# Patient Record
Sex: Male | Born: 1995 | Race: White | Hispanic: No | Marital: Single | Smoking: Never smoker
Health system: Southern US, Community
[De-identification: ages and names within clinical notes are randomized; demographics above are authoritative.]

## PROBLEM LIST (undated history)

## (undated) DIAGNOSIS — Z8489 Family history of other specified conditions: Secondary | ICD-10-CM

---
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
## 2019-05-30 ENCOUNTER — Ambulatory Visit: Payer: Self-pay | Admitting: Surgery

## 2019-05-30 NOTE — H&P (Signed)
Patrick Hopkins Documented: 05/30/2019 9:58 AM Location: [facility] Patient #: [id number] DOB: 12/06/1995 Undefined / Language: English / Race: White Male  History of Present Illness (Patrick Hopkins A. Rochelle Nephew MD; 05/30/2019 10:36 AM) Patient words: Patient sent at the request of Dr. Elaine Hopkins for right groin pain. He reports history of right groin pain and bulge the last 2 months. The pain is made worse with lifting, coughing or straining. He notices a small bulge in his right groin that pops in and pops out. The symptoms are interfering with his quality of life. The pain does radiate down into his testicle specimen was coughing. Denies any burning when he urinates. Denies nausea vomiting or abdominal pain otherwise.  The patient is a 22 year old male.   Past Surgical History (Patrick Hopkins, [name]; 05/30/2019 9:58 AM) No pertinent past surgical history  Diagnostic Studies History (Patrick Hopkins, [name]; 05/30/2019 9:58 AM) Colonoscopy never  Allergies (Patrick Hopkins, [name]; 05/30/2019 9:58 AM) No Known Drug Allergies [05/30/2019]:  Medication History (Patrick Hopkins, [name]; 05/30/2019 9:58 AM) No Current Medications Medications Reconciled  Social History (Patrick Hopkins, [name]; 05/30/2019 9:58 AM) Alcohol use Occasional alcohol use. Illicit drug use Prefer to discuss with provider. Tobacco use Never smoker.  Family History (Patrick Hopkins, [name]; 05/30/2019 9:58 AM) Alcohol Abuse Family Members In General.  Other Problems (Patrick Hopkins, [name]; 05/30/2019 9:58 AM) Back Pain     Review of Systems (Patrick Hopkins [name]; 05/30/2019 9:58 AM) General Not Present- Appetite Loss, Chills, Fatigue, Fever, Night Sweats, Weight Gain and Weight Loss. Skin Not Present- Change in Wart/Mole, Dryness, Hives, Jaundice, New Lesions, Non-Healing Wounds, Rash and Ulcer. HEENT Not Present- Earache, Hearing Loss, Hoarseness, Nose Bleed, Oral Ulcers, Ringing in the  Ears, Seasonal Allergies, Sinus Pain, Sore Throat, Visual Disturbances, Wears glasses/contact lenses and Yellow Eyes. Respiratory Not Present- Bloody sputum, Chronic Cough, Difficulty Breathing, Snoring and Wheezing. Breast Not Present- Breast Mass, Breast Pain, Nipple Discharge and Skin Changes. Cardiovascular Not Present- Chest Pain, Difficulty Breathing Lying Down, Leg Cramps, Palpitations, Rapid Heart Rate, Shortness of Breath and Swelling of Extremities. Gastrointestinal Present- Abdominal Pain. Not Present- Bloating, Bloody Stool, Change in Bowel Habits, Chronic diarrhea, Constipation, Difficulty Swallowing, Excessive gas, Gets full quickly at meals, Hemorrhoids, Indigestion, Nausea, Rectal Pain and Vomiting. Male Genitourinary Not Present- Blood in Urine, Change in Urinary Stream, Frequency, Impotence, Nocturia, Painful Urination, Urgency and Urine Leakage. Musculoskeletal Present- Back Pain. Not Present- Joint Pain, Joint Stiffness, Muscle Pain, Muscle Weakness and Swelling of Extremities. Neurological Not Present- Decreased Memory, Fainting, Headaches, Numbness, Seizures, Tingling, Tremor, Trouble walking and Weakness. Endocrine Not Present- Cold Intolerance, Excessive Hunger, Hair Changes, Heat Intolerance, Hot flashes and New Diabetes. Hematology Not Present- Blood Thinners, Easy Bruising, Excessive bleeding, Gland problems, HIV and Persistent Infections.  Vitals (Patrick Hopkins [name]; 05/30/2019 9:58 AM) 05/30/2019 9:58 AM Weight: 165.5 lb Height: 72in Body Surface Area: 1.97 m Body Mass Index: 22.45 kg/m  Pulse: 80 (Regular)  BP: 110/72 (Sitting, Left Arm, Standard)        Physical Exam (Patrick Hopkins A. Patrick Kovacic MD; 05/30/2019 10:37 AM)  General Mental Status-Alert. General Appearance-Consistent with stated age. Hydration-Well hydrated. Voice-Normal.  Head and Neck Head-normocephalic, atraumatic with no lesions or palpable  masses. Trachea-midline. Thyroid Gland Characteristics - normal size and consistency.  Eye Eyeball - Bilateral-Extraocular movements intact. Sclera/Conjunctiva - Bilateral-No scleral icterus.  Chest and Lung Exam Chest and lung exam reveals -quiet, even and easy respiratory effort with no use   of accessory muscles and on auscultation, normal breath sounds, no adventitious sounds and normal vocal resonance. Inspection Chest Wall - Normal. Back - normal.  Cardiovascular Cardiovascular examination reveals -normal heart sounds, regular rate and rhythm with no murmurs and normal pedal pulses bilaterally.  Abdomen Note: Reducible small right inguinal hernia. No evidence of left inguinal hernia.  Male Genitourinary Scrotum-Normal. Testes - Left-Normal - Left. Testes - Right-Normal - Right.  Neurologic Neurologic evaluation reveals -alert and oriented x 3 with no impairment of recent or remote memory. Mental Status-Normal.  Musculoskeletal Normal Exam - Left-Upper Extremity Strength Normal and Lower Extremity Strength Normal. Normal Exam - Right-Upper Extremity Strength Normal and Lower Extremity Strength Normal.    Assessment & Plan (Patrick Hopkins A. Patrick Maxson MD; 05/30/2019 10:38 AM)  RIGHT INGUINAL HERNIA (K40.90) Impression: Discussed repair with mesh versus observation. Discussed laparoscopic and open techniques. Patient shows an open right inguinal hernia repair with mesh. Discussed risk of chronic pain especially with tobacco use and complications associated with that. The risk of hernia repair include bleeding, infection, organ injury, bowel injury, bladder injury, nerve injury recurrent hernia, blood clots, worsening of underlying condition, chronic pain, mesh use, open surgery, death, and the need for other operattions. Pt agrees to proceed  Current Plans You are being scheduled for surgery- Our schedulers will call you.  You should hear from our office's  scheduling department within 5 working days about the location, date, and time of surgery. We try to make accommodations for patient's preferences in scheduling surgery, but sometimes the OR schedule or the surgeon's schedule prevents [place] from making those accommodations.  If you have not heard from our office [phone] in 5 working days, call the office and ask for your surgeon's nurse.  If you have other questions about your diagnosis, plan, or surgery, call the office and ask for your surgeon's nurse.  Pt Education - Pamphlet Given - Hernia Surgery: discussed with patient and provided information. Pt Education - CCS Mesh education: discussed with patient and provided information. Pt Education - Consent for inguinal hernia : discussed with patient and provided information.

## 2019-05-30 NOTE — H&P (View-Only) (Signed)
Patrick Hopkins Documented: 05/30/2019 9:58 AM Location: [facility] Patient #: [id number] DOB: 05-Nov-1995 Undefined / Language: [place] / Race: White Male  History of Present Illness [name](Janace Decker A. Jasmene Goswami MD; 05/30/2019 10:36 AM) Patient words: Patient sent at the request of Dr. Maurice SmallElaine Griffin for right groin pain. He reports history of right groin pain and bulge the last 2 months. The pain is made worse with lifting, coughing or straining. He notices a small bulge in his right groin that pops in and pops out. The symptoms are interfering with his quality of life. The pain does radiate down into his testicle specimen was coughing. Denies any burning when he urinates. Denies nausea vomiting or abdominal pain otherwise.  The patient is a 23 year old male.   Past Surgical History Marcelino Duster([name], [name]; 05/30/2019 9:58 AM) No pertinent past surgical history  Diagnostic Studies History Marcelino Duster([name], [name]; 05/30/2019 9:58 AM) Colonoscopy never  Allergies Marcelino Duster([name], [name]; 05/30/2019 9:58 AM) No Known Drug Allergies [05/30/2019]:  Medication History Marcelino Duster([name], [name]; 05/30/2019 9:58 AM) No Current Medications Medications Reconciled  Social History Marcelino Duster([name], [name]; 05/30/2019 9:58 AM) Alcohol use Occasional alcohol use. Illicit drug use Prefer to discuss with provider. Tobacco use Never smoker.  Family History Marcelino Duster(Michelle R. Shon BatonBrooks, [name]; 05/30/2019 9:58 AM) Alcohol Abuse Family Members In General.  Other Problems Marcelino Duster([name], [name]; 05/30/2019 9:58 AM) Back Pain     Review of Systems [facility]([name] [name]; 05/30/2019 9:58 AM) General Not Present- Appetite Loss, Chills, Fatigue, Fever, Night Sweats, Weight Gain and Weight Loss. Skin Not Present- Change in Wart/Mole, Dryness, Hives, Jaundice, New Lesions, Non-Healing Wounds, Rash and Ulcer. HEENT Not Present- Earache, Hearing Loss, Hoarseness, Nose Bleed, Oral Ulcers, Ringing in the  Ears, Seasonal Allergies, Sinus Pain, Sore Throat, Visual Disturbances, Wears glasses/contact lenses and Yellow Eyes. Respiratory Not Present- Bloody sputum, Chronic Cough, Difficulty Breathing, Snoring and Wheezing. Breast Not Present- Breast Mass, Breast Pain, Nipple Discharge and Skin Changes. Cardiovascular Not Present- Chest Pain, Difficulty Breathing Lying Down, Leg Cramps, Palpitations, Rapid Heart Rate, Shortness of Breath and Swelling of Extremities. Gastrointestinal Present- Abdominal Pain. Not Present- Bloating, Bloody Stool, Change in Bowel Habits, Chronic diarrhea, Constipation, Difficulty Swallowing, Excessive gas, Gets full quickly at meals, Hemorrhoids, Indigestion, Nausea, Rectal Pain and Vomiting. Male Genitourinary Not Present- Blood in Urine, Change in Urinary Stream, Frequency, Impotence, Nocturia, Painful Urination, Urgency and Urine Leakage. Musculoskeletal Present- Back Pain. Not Present- Joint Pain, Joint Stiffness, Muscle Pain, Muscle Weakness and Swelling of Extremities. Neurological Not Present- Decreased Memory, Fainting, Headaches, Numbness, Seizures, Tingling, Tremor, Trouble walking and Weakness. Endocrine Not Present- Cold Intolerance, Excessive Hunger, Hair Changes, Heat Intolerance, Hot flashes and New Diabetes. Hematology Not Present- Blood Thinners, Easy Bruising, Excessive bleeding, Gland problems, HIV and Persistent Infections.  Vitals [employer]([name] [name]; 05/30/2019 9:58 AM) 05/30/2019 9:58 AM Weight: 165.5 lb Height: 72in Body Surface Area: 1.97 m Body Mass Index: 22.45 kg/m  Pulse: 80 (Regular)  BP: 110/72 (Sitting, Left Arm, Standard)        Physical Exam (Aryon Nham A. Anayiah Howden MD; 05/30/2019 10:37 AM)  General Mental Status-Alert. General Appearance-Consistent with stated age. Hydration-Well hydrated. Voice-Normal.  Head and Neck Head-normocephalic, atraumatic with no lesions or palpable  masses. Trachea-midline. Thyroid Gland Characteristics - normal size and consistency.  Eye Eyeball - Bilateral-Extraocular movements intact. Sclera/Conjunctiva - Bilateral-No scleral icterus.  Chest and Lung Exam Chest and lung exam reveals -quiet, even and easy respiratory effort with no use  of accessory muscles and on auscultation, normal breath sounds, no adventitious sounds and normal vocal resonance. Inspection Chest Wall - Normal. Back - normal.  Cardiovascular Cardiovascular examination reveals -normal heart sounds, regular rate and rhythm with no murmurs and normal pedal pulses bilaterally.  Abdomen Note: Reducible small right inguinal hernia. No evidence of left inguinal hernia.  Male Genitourinary Scrotum-Normal. Testes - Left-Normal - Left. Testes - Right-Normal - Right.  Neurologic Neurologic evaluation reveals -alert and oriented x 3 with no impairment of recent or remote memory. Mental Status-Normal.  Musculoskeletal Normal Exam - Left-Upper Extremity Strength Normal and Lower Extremity Strength Normal. Normal Exam - Right-Upper Extremity Strength Normal and Lower Extremity Strength Normal.    Assessment & Plan (Trinidi Toppins A. Gao Mitnick MD; 05/30/2019 10:38 AM)  RIGHT INGUINAL HERNIA (K40.90) Impression: Discussed repair with mesh versus observation. Discussed laparoscopic and open techniques. Patient shows an open right inguinal hernia repair with mesh. Discussed risk of chronic pain especially with tobacco use and complications associated with that. The risk of hernia repair include bleeding, infection, organ injury, bowel injury, bladder injury, nerve injury recurrent hernia, blood clots, worsening of underlying condition, chronic pain, mesh use, open surgery, death, and the need for other operattions. Pt agrees to proceed  Current Plans You are being scheduled for surgery- Our schedulers will call you.  You should hear from our office's  scheduling department within 5 working days about the location, date, and time of surgery. We try to make accommodations for patient's preferences in scheduling surgery, but sometimes the OR schedule or the surgeon's schedule prevents [place] from making those accommodations.  If you have not heard from our office [phone] in 5 working days, call the office and ask for your surgeon's nurse.  If you have other questions about your diagnosis, plan, or surgery, call the office and ask for your surgeon's nurse.  Pt Education - Pamphlet Given - Hernia Surgery: discussed with patient and provided information. Pt Education - CCS Mesh education: discussed with patient and provided information. Pt Education - Consent for inguinal hernia : discussed with patient and provided information.

## 2019-06-22 ENCOUNTER — Other Ambulatory Visit: Payer: Self-pay

## 2019-06-22 ENCOUNTER — Encounter (HOSPITAL_BASED_OUTPATIENT_CLINIC_OR_DEPARTMENT_OTHER): Payer: Self-pay

## 2019-06-25 ENCOUNTER — Other Ambulatory Visit (HOSPITAL_COMMUNITY)
Admission: RE | Admit: 2019-06-25 | Discharge: 2019-06-25 | Disposition: A | Discharge status: Home / Self Care | Payer: BC Managed Care – PPO | Source: Ambulatory Visit | Attending: Surgery | Admitting: Surgery

## 2019-06-25 DIAGNOSIS — Z01812 Encounter for preprocedural laboratory examination: Secondary | ICD-10-CM | POA: Diagnosis present

## 2019-06-25 DIAGNOSIS — K4090 Unilateral inguinal hernia, without obstruction or gangrene, not specified as recurrent: Secondary | ICD-10-CM | POA: Insufficient documentation

## 2019-06-25 DIAGNOSIS — Z20828 Contact with and (suspected) exposure to other viral communicable diseases: Secondary | ICD-10-CM | POA: Diagnosis not present

## 2019-06-26 LAB — NOVEL CORONAVIRUS, NAA (HOSP ORDER, SEND-OUT TO REF LAB; TAT 18-24 HRS): SARS-CoV-2, NAA: NOT DETECTED

## 2019-06-27 ENCOUNTER — Other Ambulatory Visit: Payer: Self-pay

## 2019-06-27 ENCOUNTER — Encounter (HOSPITAL_BASED_OUTPATIENT_CLINIC_OR_DEPARTMENT_OTHER)
Admission: RE | Admit: 2019-06-27 | Discharge: 2019-06-27 | Disposition: A | Discharge status: Home / Self Care | Payer: BC Managed Care – PPO | Source: Ambulatory Visit | Attending: Surgery | Admitting: Surgery

## 2019-06-28 DIAGNOSIS — R1031 Right lower quadrant pain: Secondary | ICD-10-CM | POA: Diagnosis present

## 2019-06-28 DIAGNOSIS — K4090 Unilateral inguinal hernia, without obstruction or gangrene, not specified as recurrent: Secondary | ICD-10-CM | POA: Diagnosis not present

## 2019-06-28 LAB — COMPREHENSIVE METABOLIC PANEL
ALT: 15 U/L (ref 0–44)
AST: 21 U/L (ref 15–41)
Albumin: 4.5 g/dL (ref 3.5–5.0)
Alkaline Phosphatase: 50 U/L (ref 38–126)
Anion gap: 11 (ref 5–15)
BUN: 11 mg/dL (ref 6–20)
CO2: 27 mmol/L (ref 22–32)
Calcium: 9.4 mg/dL (ref 8.9–10.3)
Chloride: 102 mmol/L (ref 98–111)
Creatinine, Ser: 1.12 mg/dL (ref 0.61–1.24)
GFR calc Af Amer: >60 mL/min (ref >60)
GFR calc non Af Amer: >60 mL/min (ref >60)
Glucose, Bld: 95 mg/dL (ref 70–99)
Potassium: 5 mmol/L (ref 3.5–5.1)
Sodium: 140 mmol/L (ref 135–145)
Total Bilirubin: 0.7 mg/dL (ref 0.3–1.2)
Total Protein: 7 g/dL (ref 6.5–8.1)

## 2019-06-28 LAB — CBC WITH DIFFERENTIAL/PLATELET
Abs Immature Granulocytes: 0.01 10*3/uL (ref 0.00–0.07)
Basophils Absolute: 0 10*3/uL (ref 0.0–0.1)
Basophils Relative: 1 %
Eosinophils Absolute: 0.1 10*3/uL (ref 0.0–0.5)
Eosinophils Relative: 1 %
HCT: 42.9 % (ref 39.0–52.0)
Hemoglobin: 14.6 g/dL (ref 13.0–17.0)
Immature Granulocytes: 0 %
Lymphocytes Relative: 43 %
Lymphs Abs: 1.6 10*3/uL (ref 0.7–4.0)
MCH: 31.7 pg (ref 26.0–34.0)
MCHC: 34 g/dL (ref 30.0–36.0)
MCV: 93.1 fL (ref 80.0–100.0)
Monocytes Absolute: 0.3 10*3/uL (ref 0.1–1.0)
Monocytes Relative: 9 %
Neutro Abs: 1.7 10*3/uL (ref 1.7–7.7)
Neutrophils Relative %: 46 %
Platelets: 172 10*3/uL (ref 150–400)
RBC: 4.61 MIL/uL (ref 4.22–5.81)
RDW: 12.2 % (ref 11.5–15.5)
WBC: 3.7 10*3/uL — ABNORMAL LOW (ref 4.0–10.5)
nRBC: 0 % (ref 0.0–0.2)

## 2019-06-28 NOTE — Progress Notes (Signed)

## 2019-06-28 NOTE — Anesthesia Preprocedure Evaluation (Addendum)
Anesthesia Evaluation  Patient identified by MRN, date of birth, ID band Patient awake    Reviewed: Allergy & Precautions, NPO status , Patient's Chart, lab work & pertinent test results  History of Anesthesia Complications (+) Family history of anesthesia reaction  Airway Mallampati: II  TM Distance: >3 FB Neck ROM: Full    Dental no notable dental hx. (+) Teeth Intact, Dental Advisory Given   Pulmonary neg pulmonary ROS,    Pulmonary exam normal breath sounds clear to auscultation       Cardiovascular Exercise Tolerance: Good Normal cardiovascular exam Rhythm:Regular Rate:Normal     Neuro/Psych negative neurological ROS  negative psych ROS   GI/Hepatic Neg liver ROS,   Endo/Other  negative endocrine ROS  Renal/GU negative Renal ROS  negative genitourinary   Musculoskeletal negative musculoskeletal ROS (+)   Abdominal   Peds  Hematology negative hematology ROS (+)   Anesthesia Other Findings   Reproductive/Obstetrics                           Anesthesia Physical Anesthesia Plan  ASA: II  Anesthesia Plan: General   Post-op Pain Management: GA combined w/ Regional for post-op pain   Induction: Intravenous  PONV Risk Score and Plan: 3 and Treatment may vary due to age or medical condition, Ondansetron and Dexamethasone  Airway Management Planned: LMA  Additional Equipment: None  Intra-op Plan:   Post-operative Plan:   Informed Consent: I have reviewed the patients History and Physical, chart, labs and discussed the procedure including the risks, benefits and alternatives for the proposed anesthesia with the patient or authorized representative who has indicated his/her understanding and acceptance.     Dental advisory given  Plan Discussed with: CRNA and Anesthesiologist  Anesthesia Plan Comments: (LMA w  TAP Block)       Anesthesia Quick Evaluation

## 2019-06-29 ENCOUNTER — Encounter (HOSPITAL_BASED_OUTPATIENT_CLINIC_OR_DEPARTMENT_OTHER): Payer: Self-pay

## 2019-06-29 ENCOUNTER — Ambulatory Visit (HOSPITAL_BASED_OUTPATIENT_CLINIC_OR_DEPARTMENT_OTHER): Payer: BC Managed Care – PPO | Admitting: Certified Registered Nurse Anesthetist

## 2019-06-29 ENCOUNTER — Other Ambulatory Visit: Payer: Self-pay

## 2019-06-29 ENCOUNTER — Encounter (HOSPITAL_BASED_OUTPATIENT_CLINIC_OR_DEPARTMENT_OTHER)
Admission: RE | Disposition: A | Discharge status: Home / Self Care | Payer: Self-pay | Source: Home / Self Care | Attending: Surgery

## 2019-06-29 ENCOUNTER — Ambulatory Visit (HOSPITAL_BASED_OUTPATIENT_CLINIC_OR_DEPARTMENT_OTHER)
Admission: RE | Admit: 2019-06-29 | Discharge: 2019-06-29 | Disposition: A | Discharge status: Home / Self Care | Payer: BC Managed Care – PPO | Attending: Surgery | Admitting: Surgery

## 2019-06-29 DIAGNOSIS — K4090 Unilateral inguinal hernia, without obstruction or gangrene, not specified as recurrent: Secondary | ICD-10-CM | POA: Insufficient documentation

## 2019-06-29 HISTORY — DX: Family history of other specified conditions: Z84.89

## 2019-06-29 HISTORY — PX: INGUINAL HERNIA REPAIR: SHX194

## 2019-06-29 SURGERY — REPAIR, HERNIA, INGUINAL, ADULT
Anesthesia: General | Site: Groin | Laterality: Right

## 2019-06-29 MED ORDER — IBUPROFEN 800 MG PO TABS: 800.0000 mg | ORAL_TABLET | Freq: Three times a day (TID) | ORAL | 0 refills | Status: AC | PRN

## 2019-06-29 MED ORDER — ONDANSETRON HCL 4 MG/2ML IJ SOLN
INTRAMUSCULAR | Status: AC
  Filled 2019-06-29: qty 2

## 2019-06-29 MED ORDER — ONDANSETRON HCL 4 MG/2ML IJ SOLN
INTRAMUSCULAR | Status: DC | PRN
  Administered 2019-06-29: 4 mg via INTRAVENOUS

## 2019-06-29 MED ORDER — ONDANSETRON HCL 4 MG/2ML IJ SOLN: 4.0000 mg | Freq: Once | INTRAMUSCULAR | Status: DC | PRN

## 2019-06-29 MED ORDER — OXYCODONE HCL 5 MG PO TABS: 5.0000 mg | ORAL_TABLET | Freq: Four times a day (QID) | ORAL | 0 refills | Status: DC | PRN

## 2019-06-29 MED ORDER — FENTANYL CITRATE (PF) 100 MCG/2ML IJ SOLN
INTRAMUSCULAR | Status: AC
  Filled 2019-06-29: qty 2

## 2019-06-29 MED ORDER — DEXAMETHASONE SODIUM PHOSPHATE 10 MG/ML IJ SOLN
INTRAMUSCULAR | Status: DC | PRN
  Administered 2019-06-29: 4 mg via INTRAVENOUS

## 2019-06-29 MED ORDER — CEFAZOLIN SODIUM-DEXTROSE 2-4 GM/100ML-% IV SOLN
INTRAVENOUS | Status: AC
  Filled 2019-06-29: qty 100

## 2019-06-29 MED ORDER — CHLORHEXIDINE GLUCONATE CLOTH 2 % EX PADS: 6.0000 | MEDICATED_PAD | Freq: Once | CUTANEOUS | Status: DC

## 2019-06-29 MED ORDER — GABAPENTIN 300 MG PO CAPS
300.0000 mg | ORAL_CAPSULE | ORAL | Status: AC
  Administered 2019-06-29: 09:00:00 300 mg via ORAL

## 2019-06-29 MED ORDER — EPHEDRINE SULFATE-NACL 50-0.9 MG/10ML-% IV SOSY
PREFILLED_SYRINGE | INTRAVENOUS | Status: DC | PRN
  Administered 2019-06-29 (×2): 10 mg via INTRAVENOUS
  Administered 2019-06-29: 5 mg via INTRAVENOUS

## 2019-06-29 MED ORDER — PROPOFOL 10 MG/ML IV BOLUS
INTRAVENOUS | Status: AC
  Filled 2019-06-29: qty 20

## 2019-06-29 MED ORDER — MEPERIDINE HCL 25 MG/ML IJ SOLN: 6.2500 mg | INTRAMUSCULAR | Status: DC | PRN

## 2019-06-29 MED ORDER — LIDOCAINE 2% (20 MG/ML) 5 ML SYRINGE
INTRAMUSCULAR | Status: DC | PRN
  Administered 2019-06-29: 100 mg via INTRAVENOUS

## 2019-06-29 MED ORDER — ROCURONIUM BROMIDE 10 MG/ML (PF) SYRINGE
PREFILLED_SYRINGE | INTRAVENOUS | Status: AC
  Filled 2019-06-29: qty 10

## 2019-06-29 MED ORDER — MIDAZOLAM HCL 2 MG/2ML IJ SOLN
INTRAMUSCULAR | Status: AC
  Filled 2019-06-29: qty 2

## 2019-06-29 MED ORDER — ROPIVACAINE HCL 5 MG/ML IJ SOLN
INTRAMUSCULAR | Status: DC | PRN
  Administered 2019-06-29: 150 mg

## 2019-06-29 MED ORDER — HYDROMORPHONE HCL 1 MG/ML IJ SOLN: 0.2500 mg | INTRAMUSCULAR | Status: DC | PRN

## 2019-06-29 MED ORDER — CLONIDINE HCL (ANALGESIA) 100 MCG/ML EP SOLN
EPIDURAL | Status: DC | PRN
  Administered 2019-06-29: 100 ug

## 2019-06-29 MED ORDER — MIDAZOLAM HCL 2 MG/2ML IJ SOLN
1.0000 mg | INTRAMUSCULAR | Status: DC | PRN
  Administered 2019-06-29: 2 mg via INTRAVENOUS

## 2019-06-29 MED ORDER — CEFAZOLIN SODIUM-DEXTROSE 2-4 GM/100ML-% IV SOLN
2.0000 g | INTRAVENOUS | Status: AC
  Administered 2019-06-29: 2 g via INTRAVENOUS

## 2019-06-29 MED ORDER — PROPOFOL 10 MG/ML IV BOLUS
INTRAVENOUS | Status: DC | PRN
  Administered 2019-06-29: 200 mg via INTRAVENOUS

## 2019-06-29 MED ORDER — OXYCODONE HCL 5 MG PO TABS: 5.0000 mg | ORAL_TABLET | Freq: Once | ORAL | Status: DC | PRN

## 2019-06-29 MED ORDER — LACTATED RINGERS IV SOLN
INTRAVENOUS | Status: DC | PRN
  Administered 2019-06-29: 09:00:00 via INTRAVENOUS

## 2019-06-29 MED ORDER — DEXAMETHASONE SODIUM PHOSPHATE 10 MG/ML IJ SOLN
INTRAMUSCULAR | Status: AC
  Filled 2019-06-29: qty 1

## 2019-06-29 MED ORDER — ACETAMINOPHEN 500 MG PO TABS
1000.0000 mg | ORAL_TABLET | ORAL | Status: AC
  Administered 2019-06-29: 1000 mg via ORAL

## 2019-06-29 MED ORDER — ACETAMINOPHEN 500 MG PO TABS
ORAL_TABLET | ORAL | Status: AC
  Filled 2019-06-29: qty 2

## 2019-06-29 MED ORDER — SCOPOLAMINE 1 MG/3DAYS TD PT72: 1.0000 | MEDICATED_PATCH | Freq: Once | TRANSDERMAL | Status: DC

## 2019-06-29 MED ORDER — LACTATED RINGERS IV SOLN
INTRAVENOUS | Status: DC
  Administered 2019-06-29: 09:00:00 via INTRAVENOUS

## 2019-06-29 MED ORDER — BUPIVACAINE HCL (PF) 0.25 % IJ SOLN
INTRAMUSCULAR | Status: DC | PRN
  Administered 2019-06-29: 7 mL

## 2019-06-29 MED ORDER — OXYCODONE HCL 5 MG/5ML PO SOLN: 5.0000 mg | Freq: Once | ORAL | Status: DC | PRN

## 2019-06-29 MED ORDER — LIDOCAINE 2% (20 MG/ML) 5 ML SYRINGE
INTRAMUSCULAR | Status: AC
  Filled 2019-06-29: qty 5

## 2019-06-29 MED ORDER — ACETAMINOPHEN 10 MG/ML IV SOLN: 1000.0000 mg | Freq: Once | INTRAVENOUS | Status: DC | PRN

## 2019-06-29 MED ORDER — FENTANYL CITRATE (PF) 100 MCG/2ML IJ SOLN
50.0000 ug | INTRAMUSCULAR | Status: DC | PRN
  Administered 2019-06-29: 100 ug via INTRAVENOUS

## 2019-06-29 MED ORDER — GABAPENTIN 300 MG PO CAPS
ORAL_CAPSULE | ORAL | Status: AC
  Filled 2019-06-29: qty 1

## 2019-06-29 MED ORDER — PHENYLEPHRINE 40 MCG/ML (10ML) SYRINGE FOR IV PUSH (FOR BLOOD PRESSURE SUPPORT)
PREFILLED_SYRINGE | INTRAVENOUS | Status: DC | PRN
  Administered 2019-06-29: 80 ug via INTRAVENOUS
  Administered 2019-06-29: 120 ug via INTRAVENOUS
  Administered 2019-06-29: 80 ug via INTRAVENOUS

## 2019-06-29 SURGICAL SUPPLY — 56 items
ADH SKN CLS APL DERMABOND .7 (GAUZE/BANDAGES/DRESSINGS) ×1
APL PRP STRL LF DISP 70% ISPRP (MISCELLANEOUS) ×1
BLADE CLIPPER SURG (BLADE) ×1 IMPLANT
BLADE SURG 15 STRL LF DISP TIS (BLADE) ×1 IMPLANT
BLADE SURG 15 STRL SS (BLADE) ×2
CANISTER SUCT 1200ML W/VALVE (MISCELLANEOUS) ×1 IMPLANT
CHLORAPREP W/TINT 26 (MISCELLANEOUS) ×2 IMPLANT
COVER BACK TABLE REUSABLE LG (DRAPES) ×2 IMPLANT
COVER MAYO STAND REUSABLE (DRAPES) ×2 IMPLANT
COVER WAND RF STERILE (DRAPES) IMPLANT
DECANTER SPIKE VIAL GLASS SM (MISCELLANEOUS) IMPLANT
DERMABOND ADVANCED (GAUZE/BANDAGES/DRESSINGS) ×1
DERMABOND ADVANCED .7 DNX12 (GAUZE/BANDAGES/DRESSINGS) ×1 IMPLANT
DRAIN PENROSE 1/2X12 LTX STRL (WOUND CARE) ×2 IMPLANT
DRAPE LAPAROTOMY TRNSV 102X78 (DRAPES) ×2 IMPLANT
DRAPE UTILITY XL STRL (DRAPES) ×2 IMPLANT
ELECT COATED BLADE 2.86 ST (ELECTRODE) ×2 IMPLANT
ELECT REM PT RETURN 9FT ADLT (ELECTROSURGICAL) ×2
ELECTRODE REM PT RTRN 9FT ADLT (ELECTROSURGICAL) ×1 IMPLANT
GAUZE 4X4 16PLY RFD (DISPOSABLE) IMPLANT
GAUZE SPONGE 4X4 12PLY STRL LF (GAUZE/BANDAGES/DRESSINGS) IMPLANT
GLOVE BIO SURGEON STRL SZ7 (GLOVE) ×1 IMPLANT
GLOVE BIOGEL PI IND STRL 7.0 (GLOVE) IMPLANT
GLOVE BIOGEL PI IND STRL 7.5 (GLOVE) IMPLANT
GLOVE BIOGEL PI IND STRL 8 (GLOVE) ×1 IMPLANT
GLOVE BIOGEL PI INDICATOR 7.0 (GLOVE) ×1
GLOVE BIOGEL PI INDICATOR 7.5 (GLOVE) ×1
GLOVE BIOGEL PI INDICATOR 8 (GLOVE) ×1
GLOVE ECLIPSE 8.0 STRL XLNG CF (GLOVE) ×2 IMPLANT
GOWN STRL REUS W/ TWL LRG LVL3 (GOWN DISPOSABLE) ×2 IMPLANT
GOWN STRL REUS W/ TWL XL LVL3 (GOWN DISPOSABLE) IMPLANT
GOWN STRL REUS W/TWL LRG LVL3 (GOWN DISPOSABLE) ×2
GOWN STRL REUS W/TWL XL LVL3 (GOWN DISPOSABLE) ×2
MESH PARIETEX PROGRIP RIGHT (Mesh General) ×1 IMPLANT
NDL HYPO 25X1 1.5 SAFETY (NEEDLE) ×1 IMPLANT
NEEDLE HYPO 25X1 1.5 SAFETY (NEEDLE) ×2 IMPLANT
NS IRRIG 1000ML POUR BTL (IV SOLUTION) ×1 IMPLANT
PACK BASIN DAY SURGERY FS (CUSTOM PROCEDURE TRAY) ×2 IMPLANT
PENCIL BUTTON HOLSTER BLD 10FT (ELECTRODE) ×2 IMPLANT
SLEEVE SCD COMPRESS KNEE MED (MISCELLANEOUS) ×2 IMPLANT
SPONGE LAP 4X18 RFD (DISPOSABLE) ×2 IMPLANT
STRIP CLOSURE SKIN 1/2X4 (GAUZE/BANDAGES/DRESSINGS) IMPLANT
SUT MON AB 4-0 PC3 18 (SUTURE) ×2 IMPLANT
SUT NOVA 0 T19/GS 22DT (SUTURE) IMPLANT
SUT NOVA NAB DX-16 0-1 5-0 T12 (SUTURE) ×3 IMPLANT
SUT VIC AB 2-0 SH 27 (SUTURE) ×2
SUT VIC AB 2-0 SH 27XBRD (SUTURE) ×1 IMPLANT
SUT VIC AB 3-0 54X BRD REEL (SUTURE) IMPLANT
SUT VIC AB 3-0 BRD 54 (SUTURE)
SUT VICRYL 3-0 CR8 SH (SUTURE) ×2 IMPLANT
SUT VICRYL AB 2 0 TIE (SUTURE) IMPLANT
SUT VICRYL AB 2 0 TIES (SUTURE)
SYR CONTROL 10ML LL (SYRINGE) ×2 IMPLANT
TOWEL GREEN STERILE FF (TOWEL DISPOSABLE) ×2 IMPLANT
TUBE CONNECTING 20X1/4 (TUBING) ×1 IMPLANT
YANKAUER SUCT BULB TIP NO VENT (SUCTIONS) ×1 IMPLANT

## 2019-06-29 NOTE — Progress Notes (Signed)
Assisted Dr. Houser with right, ultrasound guided, transabdominal plane block. Side rails up, monitors on throughout procedure. See vital signs in flow sheet. Tolerated Procedure well.  

## 2019-06-29 NOTE — Op Note (Signed)
Preoperative diagnosis: Reducible right inguinal hernia  Postoperative diagnosis: Same  Procedure: Repair of right inguinal hernia with pro-grip mesh  Surgeon: Erroll Luna, MD  Anesthesia: General with TAP block right and local anesthetic  EBL: Minimal  Specimen: None  IV fluids: Per anesthesia record  Indications for procedure: The patient is a 23 year old male with a small reducible symptomatic right inguinal hernia.  He opted for repair after discussing techniques of laparoscopic versus open and the use of mesh and hernia repair.The risk of hernia repair include bleeding,  Infection,   Recurrence of the hernia,  Mesh use, chronic pain,  Organ injury,  Bowel injury,  Bladder injury,   nerve injury with numbness around the incision,  Death,  and worsening of preexisting  medical problems.  The alternatives to surgery have been discussed as well..  Long term expectations of both operative and non operative treatments have been discussed.   The patient agrees to proceed.  Description of procedure: The patient was met in the holding area and the right side was marked as the correct side.  Questions were answered.  A regional block was placed by anesthesia.  He was taken back to the operative room.  He is placed upon the OR table.  After induction of general esthesia, the right inguinal region was prepped and draped in a sterile fashion timeout was performed.  Proper patient and side and procedure were verified.  He received appropriate preoperative antibiotics.  A right inguinal incision was made.  Dissection was carried down through Scarpa's fascia until the aponeurosis of the external oblique was identified.  The fibers were opened in the same direction of the fibers through the external ring.  Cord structures were encircled with 1/4 inch Penrose drain as well as the ilioinguinal nerve.  These were protected.  There is a small indirect defect noted.  No evidence of indirect defect noted.   Pro-grip mesh was then placed onto the floor of the inguinal canal and carefully wrapped around the cord structures.  It was secured to the pubic tubercle, shelving edge of the inguinal ligament and the internal oblique using #1 Novafil suture.  The mesh was secured to itself around the cord structures with no undue tension around the cord or birth.  Hemostasis was achieved.  The mesh was well fixated.  The aponeurosis of the external oblique was closed with 2-0 Vicryl pop-off.  3-0 Vicryl was used approximate Scarpa's fascia and 4 Monocryl was used to close the skin in a subcuticular fashion.  Dermabond was applied.  All counts were found to be correct.  The patient was awoke extubated taken to recovery in satisfactory condition.

## 2019-06-29 NOTE — Progress Notes (Signed)
Unable to obtain oral or axillary temp.  Called Dr. Jillyn Hidden who advised ok to d/c patient as long as patient is not shivering or feeling ill.

## 2019-06-29 NOTE — Transfer of Care (Signed)
Immediate Anesthesia Transfer of Care Note  Patient: Patrick Hopkins  Procedure(s) Performed: RIGHT INGUINAL HERNIA REPAIR WITH MESH (Right Groin)  Patient Location: PACU  Anesthesia Type:General  Level of Consciousness: drowsy  Airway & Oxygen Therapy: Patient Spontanous Breathing and Patient connected to nasal cannula oxygen  Post-op Assessment: Report given to RN and Post -op Vital signs reviewed and stable  Post vital signs: Reviewed and stable  Last Vitals:  Vitals Value Taken Time  BP 115/65 06/29/19 1032  Temp    Pulse 50 06/29/19 1036  Resp 16 06/29/19 1036  SpO2 100 % 06/29/19 1036  Vitals shown include unvalidated device data.  Last Pain:  Vitals:   06/29/19 0830  TempSrc: Oral  PainSc: 1       Patients Stated Pain Goal: 4 (63/87/56 [id number])  Complications: No apparent anesthesia complications

## 2019-06-29 NOTE — Interval H&P Note (Signed)
History and Physical Interval Note:  06/29/2019 9:04 AM  Patrick Hopkins  has presented today for surgery, with the diagnosis of RIGHT INGUINAL HERNIA.  The various methods of treatment have been discussed with the patient and family. After consideration of risks, benefits and other options for treatment, the patient has consented to  Procedure(s) with comments: RIGHT INGUINAL HERNIA REPAIR WITH MESH (Right) - TAP BLOCK as a surgical intervention.  The patient's history has been reviewed, patient examined, no change in status, stable for surgery.  I have reviewed the patient's chart and labs.  Questions were answered to the patient's satisfaction.     Joud Ingwersen A Marguis Mathieson   

## 2019-06-29 NOTE — Anesthesia Postprocedure Evaluation (Signed)
Anesthesia Post Note  Patient: Patrick Hopkins  Procedure(s) Performed: RIGHT INGUINAL HERNIA REPAIR WITH MESH (Right Groin)     Anesthesia Type: General    Last Vitals:  Vitals:   06/29/19 1045 06/29/19 1100  BP: 115/73 117/71  Pulse: (!) 56 69  Resp: 16 17  Temp:    SpO2: 100% 100%    Last Pain:  Vitals:   06/29/19 1100  TempSrc:   PainSc: 2                  Barnet Glasgow

## 2019-06-29 NOTE — Discharge Instructions (Signed)
[facility] _______[facility] ° °UMBILICAL OR INGUINAL HERNIA REPAIR: POST OP INSTRUCTIONS ° °Always review your discharge instruction sheet given to you by the facility where your surgery was performed. °IF YOU HAVE DISABILITY OR FAMILY LEAVE FORMS, YOU MUST BRING THEM TO THE OFFICE FOR PROCESSING.   °DO NOT GIVE THEM TO YOUR DOCTOR. ° °1. A  prescription for pain medication may be given to you upon discharge.  Take your pain medication as prescribed, if needed.  If narcotic pain medicine is not needed, then you may take acetaminophen (Tylenol) or ibuprofen (Advil) as needed. °2. Take your usually prescribed medications unless otherwise directed. °If you need a refill on your pain medication, please contact your pharmacy.  They will contact our office to request authorization. Prescriptions will not be filled after 5 pm or on week-ends. °3. You should follow a light diet the first 24 hours after arrival home, such as soup and crackers, etc.  Be sure to include lots of fluids daily.  Resume your normal diet the day after surgery. °4.Most patients will experience some swelling and bruising around the umbilicus or in the groin and scrotum.  Ice packs and reclining will help.  Swelling and bruising can take several days to resolve.  °6. It is common to experience some constipation if taking pain medication after surgery.  Increasing fluid intake and taking a stool softener (such as Colace) will usually help or prevent this problem from occurring.  A mild laxative (Milk of Magnesia or Miralax) should be taken according to package directions if there are no bowel movements after 48 hours. °7. Unless discharge instructions indicate otherwise, you may remove your bandages 24-48 hours after surgery, and you may shower at that time.  You may have steri-strips (small skin tapes) in place directly over the incision.  These strips should be left on the skin for 7-10 days.  If your surgeon used skin glue on the  incision, you may shower in 24 hours.  The glue will flake off over the next 2-3 weeks.  Any sutures or staples will be removed at the office during your follow-up visit. °8. ACTIVITIES:  You may resume regular (light) daily activities beginning the next day--such as daily self-care, walking, climbing stairs--gradually increasing activities as tolerated.  You may have sexual intercourse when it is comfortable.  Refrain from any heavy lifting or straining until approved by your doctor. ° °a.You may drive when you are no longer taking prescription pain medication, you can comfortably wear a seatbelt, and you can safely maneuver your car and apply brakes. °b.RETURN TO WORK:   °_____________________________________________ ° °9.You should see your doctor in the office for a follow-up appointment approximately 2-3 weeks after your surgery.  Make sure that you call for this appointment within a day or two after you arrive home to insure a convenient appointment time. °10.OTHER INSTRUCTIONS: _________________________ °   _____________________________________ ° °WHEN TO CALL YOUR DOCTOR: °1. Fever over 101.0 °2. Inability to urinate °3. Nausea and/or vomiting °4. Extreme swelling or bruising °5. Continued bleeding from incision. °6. Increased pain, redness, or drainage from the incision ° °The clinic staff is available to answer your questions during regular business hours.  Please don’t hesitate to call and ask to speak to one of the nurses for clinical concerns.  If you have a medical emergency, go to the nearest emergency room or call 911.  A surgeon from [facility] is always on call at the hospital ° ° °  [address], [place], [place], [place]  [address] ?  [address], [place], [place]   [address] [phone] ? [phone] ? FAX [phone] Web site: [email or web address]   Next dose of tylenol may be given at 2:30pm if needed   Post Anesthesia Home Care  Instructions  Activity: Get plenty of rest for the remainder of the day. A responsible individual must stay with you for 24 hours following the procedure.  For the next 24 hours, DO NOT: -Drive a car -Paediatric nurse -Drink alcoholic beverages -Take any medication unless instructed by your physician -Make any legal decisions or sign important papers.  Meals: Start with liquid foods such as gelatin or soup. Progress to regular foods as tolerated. Avoid greasy, spicy, heavy foods. If nausea and/or vomiting occur, drink only clear liquids until the nausea and/or vomiting subsides. Call your physician if vomiting continues.  Special Instructions/Symptoms: Your throat may feel dry or sore from the anesthesia or the breathing tube placed in your throat during surgery. If this causes discomfort, gargle with warm salt water. The discomfort should disappear within 24 hours.  If you had a scopolamine patch placed behind your ear for the management of post- operative nausea and/or vomiting:  1. The medication in the patch is effective for 72 hours, after which it should be removed.  Wrap patch in a tissue and discard in the trash. Wash hands thoroughly with soap and water. 2. You may remove the patch earlier than 72 hours if you experience unpleasant side effects which may include dry mouth, dizziness or visual disturbances. 3. Avoid touching the patch. Wash your hands with soap and water after contact with the patch.    Regional Anesthesia Blocks  1. Numbness or the inability to move the "blocked" extremity may last from 3-48 hours after placement. The length of time depends on the medication injected and your individual response to the medication. If the numbness is not going away after 48 hours, call your surgeon.  2. The extremity that is blocked will need to be protected until the numbness is gone and the  Strength has returned. Because you cannot feel it, you will need to take extra care to  avoid injury. Because it may be weak, you may have difficulty moving it or using it. You may not know what position it is in without looking at it while the block is in effect.  3. For blocks in the legs and feet, returning to weight bearing and walking needs to be done carefully. You will need to wait until the numbness is entirely gone and the strength has returned. You should be able to move your leg and foot normally before you try and bear weight or walk. You will need someone to be with you when you first try to ensure you do not fall and possibly risk injury.  4. Bruising and tenderness at the needle site are common side effects and will resolve in a few days.  5. Persistent numbness or new problems with movement should be communicated to the surgeon or the [place] [phone] [place] [phone]).

## 2019-06-29 NOTE — H&P (Signed)
Creola Corn Documented: 05/30/2019 9:58 AM Location: [facility] Patient #: [id number] DOB: 1995/12/10 Undefined / Language: Cleophus Molt / Race: White Male  History of Present Illness Marcello Moores A. Ceejay Kegley MD; 05/30/2019 10:36 AM) Patient words: Patient sent at the request of Dr. Kelton Pillar for right groin pain. He reports history of right groin pain and bulge the last 2 months. The pain is made worse with lifting, coughing or straining. He notices a small bulge in his right groin that pops in and pops out. The symptoms are interfering with his quality of life. The pain does radiate down into his testicle specimen was coughing. Denies any burning when he urinates. Denies nausea vomiting or abdominal pain otherwise.  The patient is a 23 year old male.   Past Surgical History Sharyn Lull R. Brooks, [name]; 05/30/2019 9:58 AM) No pertinent past surgical history  Diagnostic Studies History Sharyn Lull R. Brooks, [name]; 05/30/2019 9:58 AM) Colonoscopy never  Allergies Sharyn Lull R. Brooks, [name]; 05/30/2019 9:58 AM) No Known Drug Allergies [05/30/2019]:  Medication History Sharyn Lull R. Brooks, [name]; 05/30/2019 9:58 AM) No Current Medications Medications Reconciled  Social History Sharyn Lull R. Brooks, [name]; 05/30/2019 9:58 AM) Alcohol use Occasional alcohol use. Illicit drug use Prefer to discuss with provider. Tobacco use Never smoker.  Family History Sharyn Lull R. Rolena Infante, [name]; 05/30/2019 9:58 AM) Alcohol Abuse Family Members In General.  Other Problems Sharyn Lull R. Brooks, [name]; 05/30/2019 9:58 AM) Back Pain     Review of Systems [facility] R. Brooks [name]; 05/30/2019 9:58 AM) General Not Present- Appetite Loss, Chills, Fatigue, Fever, Night Sweats, Weight Gain and Weight Loss. Skin Not Present- Change in Wart/Mole, Dryness, Hives, Jaundice, New Lesions, Non-Healing Wounds, Rash and Ulcer. HEENT Not Present- Earache, Hearing Loss, Hoarseness, Nose Bleed, Oral Ulcers,  Ringing in the Ears, Seasonal Allergies, Sinus Pain, Sore Throat, Visual Disturbances, Wears glasses/contact lenses and Yellow Eyes. Respiratory Not Present- Bloody sputum, Chronic Cough, Difficulty Breathing, Snoring and Wheezing. Breast Not Present- Breast Mass, Breast Pain, Nipple Discharge and Skin Changes. Cardiovascular Not Present- Chest Pain, Difficulty Breathing Lying Down, Leg Cramps, Palpitations, Rapid Heart Rate, Shortness of Breath and Swelling of Extremities. Gastrointestinal Present- Abdominal Pain. Not Present- Bloating, Bloody Stool, Change in Bowel Habits, Chronic diarrhea, Constipation, Difficulty Swallowing, Excessive gas, Gets full quickly at meals, Hemorrhoids, Indigestion, Nausea, Rectal Pain and Vomiting. Male Genitourinary Not Present- Blood in Urine, Change in Urinary Stream, Frequency, Impotence, Nocturia, Painful Urination, Urgency and Urine Leakage. Musculoskeletal Present- Back Pain. Not Present- Joint Pain, Joint Stiffness, Muscle Pain, Muscle Weakness and Swelling of Extremities. Neurological Not Present- Decreased Memory, Fainting, Headaches, Numbness, Seizures, Tingling, Tremor, Trouble walking and Weakness. Endocrine Not Present- Cold Intolerance, Excessive Hunger, Hair Changes, Heat Intolerance, Hot flashes and New Diabetes. Hematology Not Present- Blood Thinners, Easy Bruising, Excessive bleeding, Gland problems, HIV and Persistent Infections.  Vitals [employer] R. Brooks [name]; 05/30/2019 9:58 AM) 05/30/2019 9:58 AM Weight: 165.5 lb Height: 72in Body Surface Area: 1.97 m Body Mass Index: 22.45 kg/m  Pulse: 80 (Regular)  BP: 110/72 (Sitting, Left Arm, Standard)        Physical Exam (Keishawn Darsey A. Emmilynn Marut MD; 05/30/2019 10:37 AM)  General Mental Status-Alert. General Appearance-Consistent with stated age. Hydration-Well hydrated. Voice-Normal.  Head and Neck Head-normocephalic, atraumatic with no lesions or palpable  masses. Trachea-midline. Thyroid Gland Characteristics - normal size and consistency.  Eye Eyeball - Bilateral-Extraocular movements intact. Sclera/Conjunctiva - Bilateral-No scleral icterus.  Chest and Lung Exam Chest and lung exam reveals -quiet, even and easy respiratory effort with no use  of accessory muscles and on auscultation, normal breath sounds, no adventitious sounds and normal vocal resonance. Inspection Chest Wall - Normal. Back - normal.  Cardiovascular Cardiovascular examination reveals -normal heart sounds, regular rate and rhythm with no murmurs and normal pedal pulses bilaterally.  Abdomen Note: Reducible small right inguinal hernia. No evidence of left inguinal hernia.  Male Genitourinary Scrotum-Normal. Testes - Left-Normal - Left. Testes - Right-Normal - Right.  Neurologic Neurologic evaluation reveals -alert and oriented x 3 with no impairment of recent or remote memory. Mental Status-Normal.  Musculoskeletal Normal Exam - Left-Upper Extremity Strength Normal and Lower Extremity Strength Normal. Normal Exam - Right-Upper Extremity Strength Normal and Lower Extremity Strength Normal.    Assessment & Plan (Shaarav Ripple A. Heidie Krall MD; 05/30/2019 10:38 AM)  RIGHT INGUINAL HERNIA (K40.90) Impression: Discussed repair with mesh versus observation. Discussed laparoscopic and open techniques. Patient shows an open right inguinal hernia repair with mesh. Discussed risk of chronic pain especially with tobacco use and complications associated with that. The risk of hernia repair include bleeding, infection, organ injury, bowel injury, bladder injury, nerve injury recurrent hernia, blood clots, worsening of underlying condition, chronic pain, mesh use, open surgery, death, and the need for other operattions. Pt agrees to proceed  Current Plans You are being scheduled for surgery- Our schedulers will call you.  You should hear from  our office's scheduling department within 5 working days about the location, date, and time of surgery. We try to make accommodations for patient's preferences in scheduling surgery, but sometimes the OR schedule or the surgeon's schedule prevents [place] from making those accommodations.  If you have not heard from our office [phone]) in 5 working days, call the office and ask for your surgeon's nurse.  If you have other questions about your diagnosis, plan, or surgery, call the office and ask for your surgeon's nurse.  Pt Education - Pamphlet Given - Hernia Surgery: discussed with patient and provided information. Pt Education - [facility] Mesh education: discussed with patient and provided information. Pt Education - Consent for inguinal hernia : discussed with patient and provided information.

## 2019-06-29 NOTE — Anesthesia Procedure Notes (Signed)
Procedure Name: LMA Insertion Performed by: Wenonah Milo H, CRNA Pre-anesthesia Checklist: Patient identified, Emergency Drugs available, Suction available and Patient being monitored Patient Re-evaluated:Patient Re-evaluated prior to induction Oxygen Delivery Method: Circle System Utilized Preoxygenation: Pre-oxygenation with 100% oxygen Induction Type: IV induction Ventilation: Mask ventilation without difficulty LMA: LMA with gastric port inserted LMA Size: 5.0 Number of attempts: 1 Airway Equipment and Method: Bite block Placement Confirmation: positive ETCO2 Tube secured with: Tape Dental Injury: Teeth and Oropharynx as per pre-operative assessment        

## 2019-06-29 NOTE — Anesthesia Procedure Notes (Addendum)
Anesthesia Regional Block: TAP block   Pre-Anesthetic Checklist: ,, timeout performed, Correct Patient, Correct Site, Correct Laterality, Correct Procedure, Correct Position, site marked, Risks and benefits discussed,  Surgical consent,  Pre-op evaluation,  At surgeon's request and post-op pain management  Laterality: Right  Prep: Maximum Sterile Barrier Precautions used, chloraprep       Needles:  Injection technique: Single-shot  Needle Type: Echogenic Needle     Needle Length: 9cm  Needle Gauge: 22     Additional Needles:   Narrative:  Start time: 06/29/2019 9:08 AM End time: 06/29/2019 9:16 AM

## 2019-06-29 NOTE — Interval H&P Note (Signed)
History and Physical Interval Note:  06/29/2019 9:04 AM  Patrick Hopkins  has presented today for surgery, with the diagnosis of RIGHT INGUINAL HERNIA.  The various methods of treatment have been discussed with the patient and family. After consideration of risks, benefits and other options for treatment, the patient has consented to  Procedure(s) with comments: [place] (Right) - TAP BLOCK as a surgical intervention.  The patient's history has been reviewed, patient examined, no change in status, stable for surgery.  I have reviewed the patient's chart and labs.  Questions were answered to the patient's satisfaction.     Camilla

## 2019-07-01 ENCOUNTER — Encounter (HOSPITAL_BASED_OUTPATIENT_CLINIC_OR_DEPARTMENT_OTHER): Payer: Self-pay | Admitting: Surgery

## 2019-10-17 ENCOUNTER — Other Ambulatory Visit: Payer: Self-pay | Admitting: Surgery

## 2019-10-17 DIAGNOSIS — N50811 Right testicular pain: Secondary | ICD-10-CM

## 2019-10-26 ENCOUNTER — Ambulatory Visit
Admission: RE | Admit: 2019-10-26 | Discharge: 2019-10-26 | Disposition: A | Discharge status: Home / Self Care | Payer: BC Managed Care – PPO | Source: Ambulatory Visit | Attending: Surgery | Admitting: Surgery

## 2019-10-26 DIAGNOSIS — N50811 Right testicular pain: Secondary | ICD-10-CM

## 2021-01-07 IMAGING — US US SCROTUM W/ DOPPLER COMPLETE
1 series · 14 of 25 positions shown · non-contrast
Comparison: None.

CLINICAL DATA: Right testicular pain for 1 year

EXAM:
SCROTAL ULTRASOUND
DOPPLER ULTRASOUND OF THE TESTICLES
TECHNIQUE: Complete ultrasound examination of the testicles, epididymis, and
other scrotal structures was performed. Color and spectral Doppler
ultrasound were also utilized to evaluate blood flow to the
testicles.

[Series 1: us scrotum w/ doppler complete · 0.06mm/px · 14 of 48 slices shown]
[im 1/48]
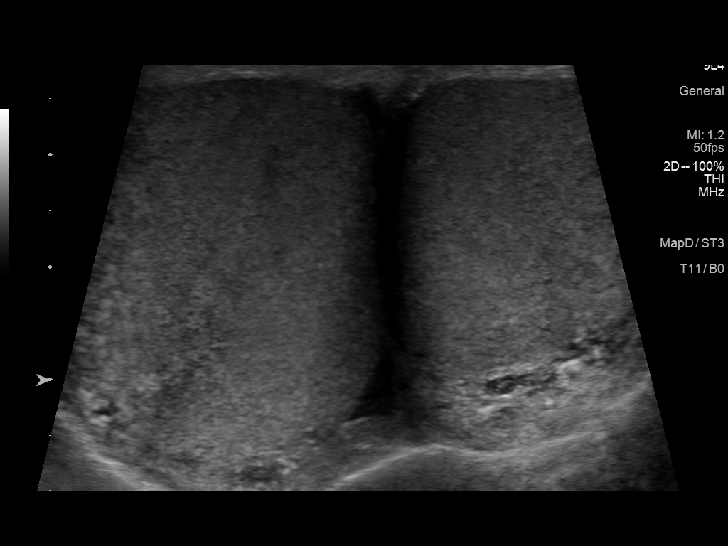
[im 4/48]
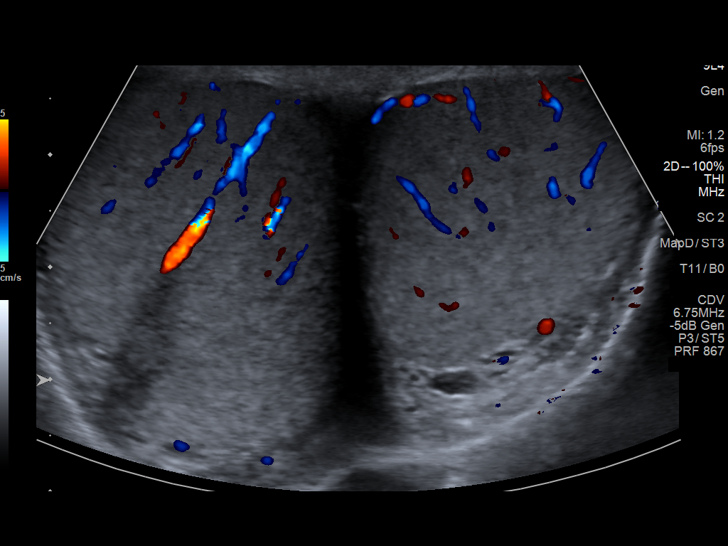
[im 8/48]
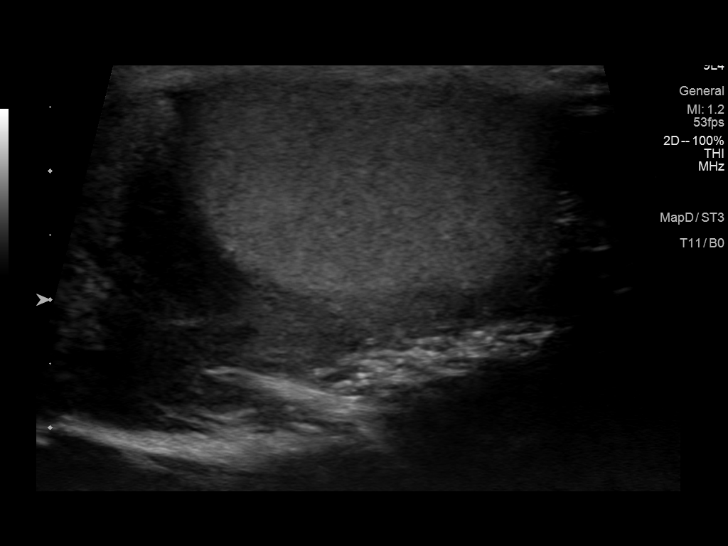
[im 12/48]
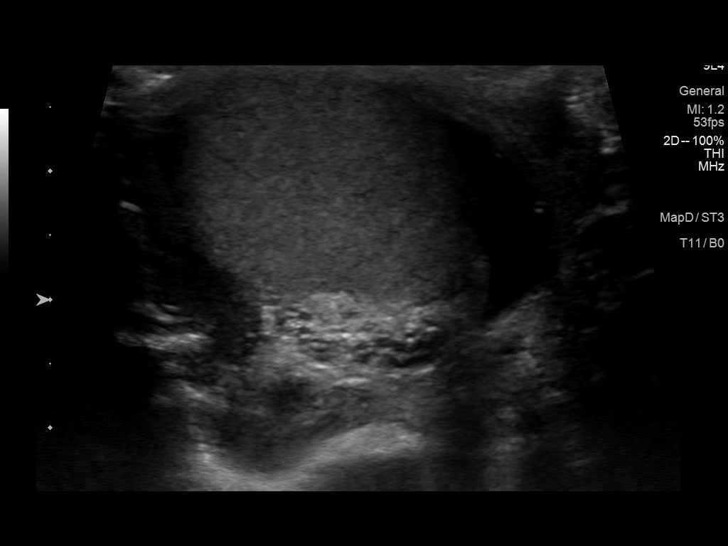
[im 16/48]
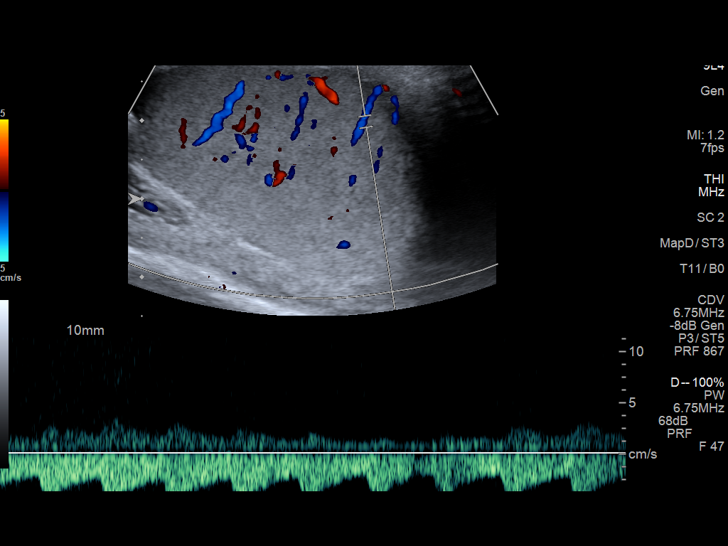
[im 18/48]
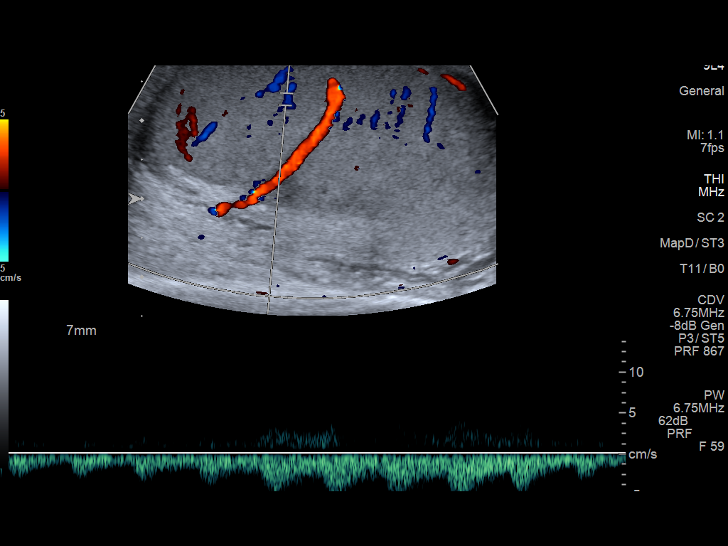
[im 22/48]
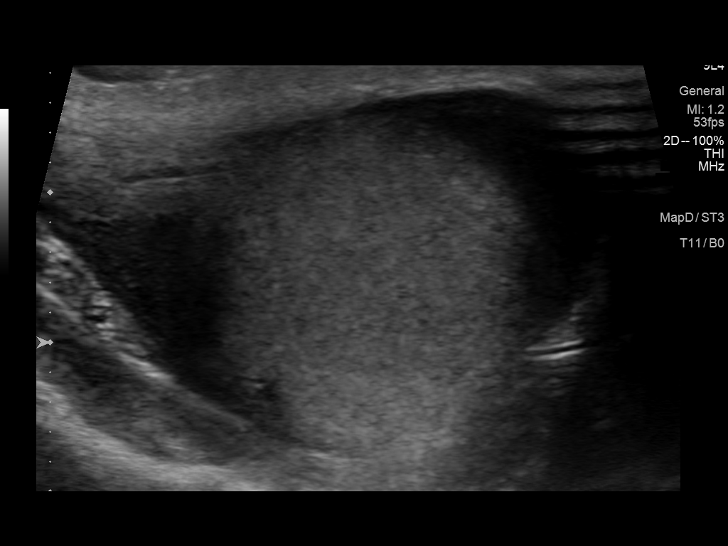
[im 26/48]
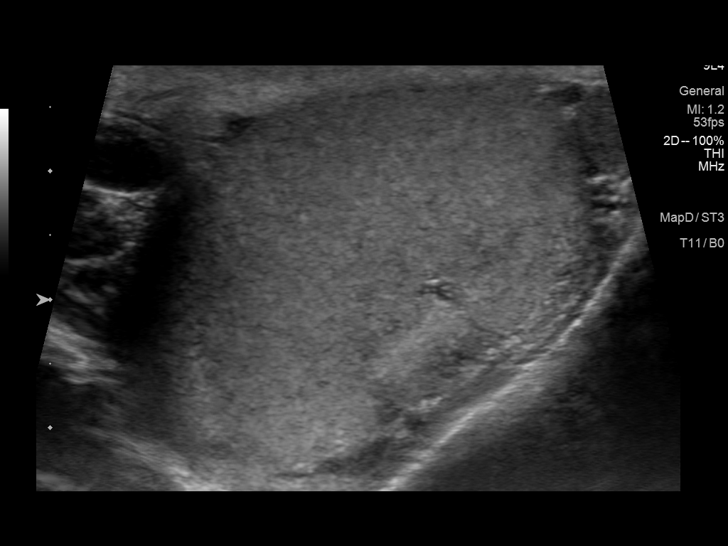
[im 30/48]
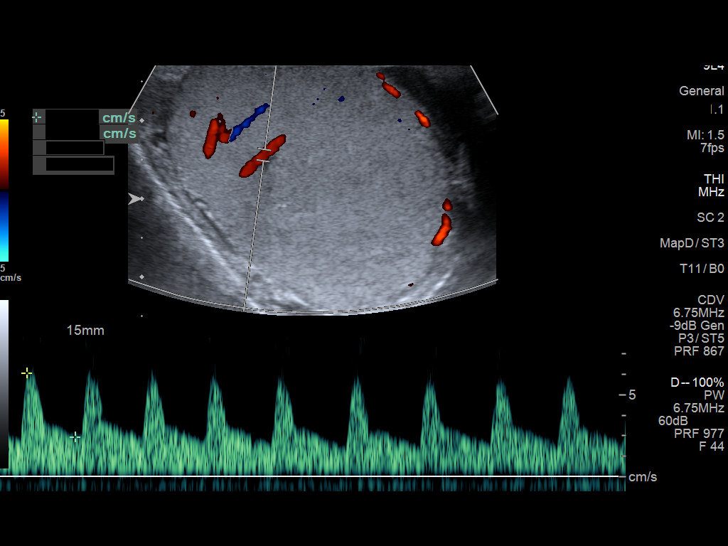
[im 32/48]
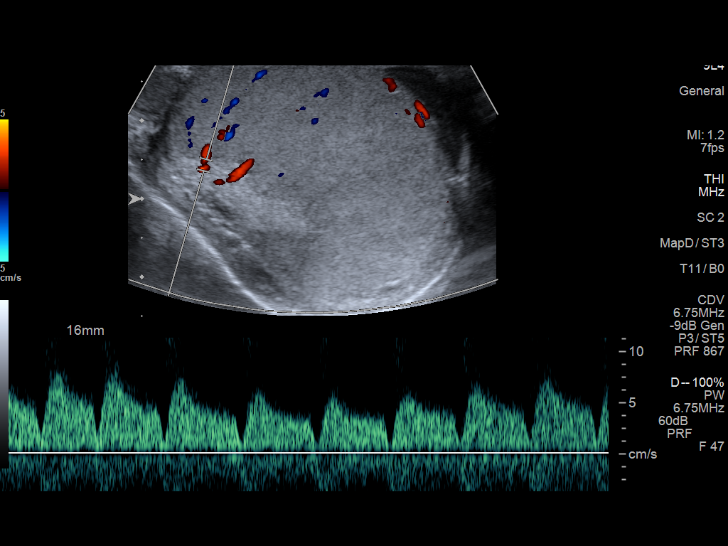
[im 36/48]
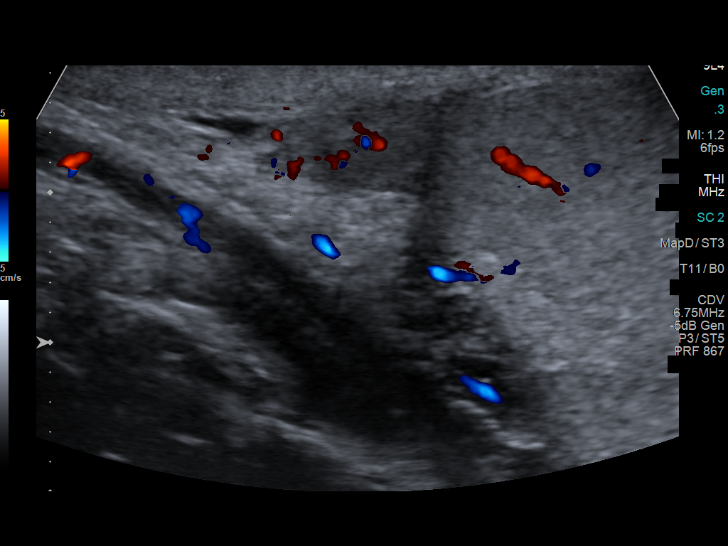
[im 40/48]
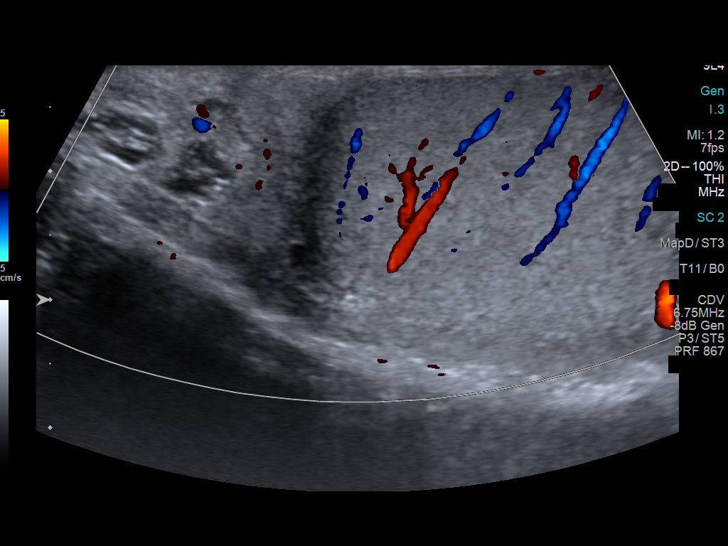
[im 44/48]
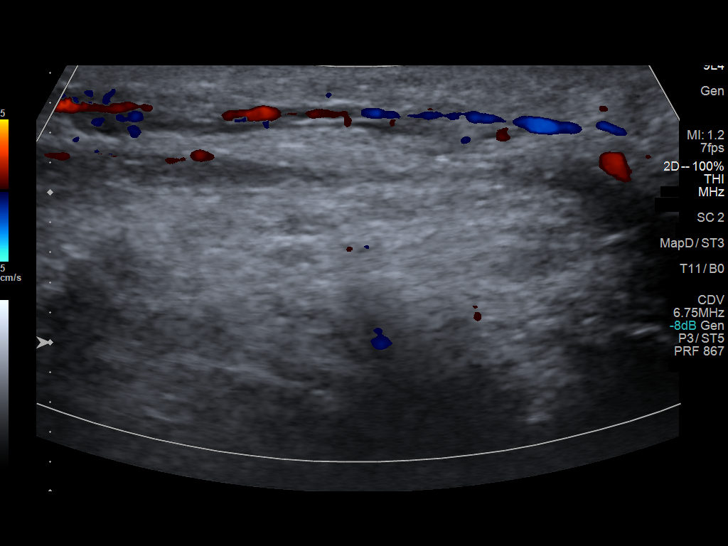
[im 48/48]
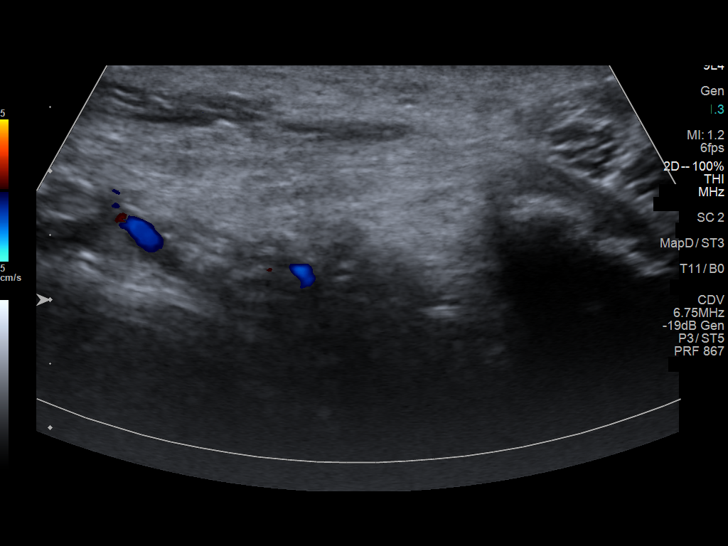

[14 of 25 positions shown; findings below may reference images not displayed]

FINDINGS: Right testicle

Measurements: 5.6 x 2.4 x 3.2 cm. No mass or microlithiasis
visualized.

Left testicle

Measurements: 4.0 x 2.5 x 3.0 cm. No mass or microlithiasis
visualized.

Right epididymis:  Normal in size and appearance.

Left epididymis:  Normal in size and appearance.

Hydrocele:  None visualized.

Varicocele:  None visualized.

Pulsed Doppler interrogation of both testes demonstrates normal low
resistance arterial and venous waveforms bilaterally.
IMPRESSION: Normal ultrasound size and appearance of the bilateral testicles.
Normal, symmetric arterial and venous Doppler flow is present
bilaterally. No ultrasound findings to explain pain.

## 2022-12-01 ENCOUNTER — Other Ambulatory Visit: Payer: Self-pay | Admitting: Surgery

## 2022-12-01 DIAGNOSIS — R1031 Right lower quadrant pain: Secondary | ICD-10-CM

## 2022-12-30 ENCOUNTER — Ambulatory Visit
Admission: RE | Admit: 2022-12-30 | Discharge: 2022-12-30 | Disposition: A | Discharge status: Home / Self Care | Payer: Managed Care, Other (non HMO) | Source: Ambulatory Visit | Attending: Surgery | Admitting: Surgery

## 2022-12-30 DIAGNOSIS — R1031 Right lower quadrant pain: Secondary | ICD-10-CM

## 2022-12-31 ENCOUNTER — Emergency Department (HOSPITAL_COMMUNITY): Payer: Managed Care, Other (non HMO)

## 2022-12-31 ENCOUNTER — Encounter (HOSPITAL_COMMUNITY): Payer: Self-pay

## 2022-12-31 ENCOUNTER — Other Ambulatory Visit: Payer: Self-pay

## 2022-12-31 ENCOUNTER — Emergency Department (HOSPITAL_COMMUNITY)
Admission: EM | Admit: 2022-12-31 | Discharge: 2022-12-31 | Disposition: A | Discharge status: Home / Self Care | Payer: Managed Care, Other (non HMO) | Attending: Emergency Medicine | Admitting: Emergency Medicine

## 2022-12-31 DIAGNOSIS — R1031 Right lower quadrant pain: Secondary | ICD-10-CM | POA: Diagnosis present

## 2022-12-31 DIAGNOSIS — N132 Hydronephrosis with renal and ureteral calculous obstruction: Secondary | ICD-10-CM

## 2022-12-31 DIAGNOSIS — N200 Calculus of kidney: Secondary | ICD-10-CM

## 2022-12-31 LAB — BASIC METABOLIC PANEL
Anion gap: 14 (ref 5–15)
BUN: 12 mg/dL (ref 6–20)
CO2: 22 mmol/L (ref 22–32)
Calcium: 9.8 mg/dL (ref 8.9–10.3)
Chloride: 100 mmol/L (ref 98–111)
Creatinine, Ser: 1.12 mg/dL (ref 0.61–1.24)
GFR, Estimated: >60 mL/min (ref >60)
Glucose, Bld: 121 mg/dL — ABNORMAL HIGH (ref 70–99)
Potassium: 3.6 mmol/L (ref 3.5–5.1)
Sodium: 136 mmol/L (ref 135–145)

## 2022-12-31 LAB — URINALYSIS, W/ REFLEX TO CULTURE (INFECTION SUSPECTED)
Bacteria, UA: NONE SEEN
Bilirubin Urine: NEGATIVE
Glucose, UA: NEGATIVE mg/dL
Ketones, ur: 5 mg/dL — AB
Leukocytes,Ua: NEGATIVE
Nitrite: NEGATIVE
Protein, ur: NEGATIVE mg/dL
Specific Gravity, Urine: 1.02 (ref 1.005–1.030)
pH: 6 (ref 5.0–8.0)

## 2022-12-31 LAB — CBC
HCT: 41.1 % (ref 39.0–52.0)
Hemoglobin: 14.3 g/dL (ref 13.0–17.0)
MCH: 31.4 pg (ref 26.0–34.0)
MCHC: 34.8 g/dL (ref 30.0–36.0)
MCV: 90.3 fL (ref 80.0–100.0)
Platelets: 191 10*3/uL (ref 150–400)
RBC: 4.55 MIL/uL (ref 4.22–5.81)
RDW: 12.2 % (ref 11.5–15.5)
WBC: 4.1 10*3/uL (ref 4.0–10.5)
nRBC: 0 % (ref 0.0–0.2)

## 2022-12-31 MED ORDER — OXYCODONE HCL 5 MG PO TABS: 5.0000 mg | ORAL_TABLET | ORAL | 0 refills | Status: DC | PRN

## 2022-12-31 MED ORDER — OXYCODONE HCL 5 MG PO TABS: 5.0000 mg | ORAL_TABLET | ORAL | 0 refills | Status: AC | PRN

## 2022-12-31 MED ORDER — TAMSULOSIN HCL 0.4 MG PO CAPS: 0.4000 mg | ORAL_CAPSULE | Freq: Every day | ORAL | 0 refills | Status: AC

## 2022-12-31 MED ORDER — LACTATED RINGERS IV BOLUS
1000.0000 mL | Freq: Once | INTRAVENOUS | Status: AC
  Administered 2022-12-31: 1000 mL via INTRAVENOUS

## 2022-12-31 MED ORDER — TAMSULOSIN HCL 0.4 MG PO CAPS
0.4000 mg | ORAL_CAPSULE | ORAL | Status: AC
  Administered 2022-12-31: 0.4 mg via ORAL
  Filled 2022-12-31: qty 1

## 2022-12-31 MED ORDER — KETOROLAC TROMETHAMINE 15 MG/ML IJ SOLN
15.0000 mg | Freq: Once | INTRAMUSCULAR | Status: AC
  Administered 2022-12-31: 15 mg via INTRAVENOUS
  Filled 2022-12-31: qty 1

## 2022-12-31 MED ORDER — ONDANSETRON HCL 4 MG/2ML IJ SOLN
4.0000 mg | Freq: Once | INTRAMUSCULAR | Status: AC
  Administered 2022-12-31: 4 mg via INTRAVENOUS
  Filled 2022-12-31: qty 2

## 2022-12-31 MED ORDER — IOHEXOL 350 MG/ML SOLN
75.0000 mL | Freq: Once | INTRAVENOUS | Status: AC | PRN
  Administered 2022-12-31: 75 mL via INTRAVENOUS

## 2022-12-31 NOTE — ED Notes (Signed)
NT sent down 2nd urine sample to lab

## 2022-12-31 NOTE — Discharge Instructions (Signed)
You were seen for your kidney stone in the emergency department.    At home, please take Tylenol and ibuprofen for your pain. You may also take the oxycodone we have prescribed you for any breakthrough pain that may have.  Do not take this before driving or operating heavy machinery.  Do not take this medication with alcohol.  Use the flowmax we have give you every day until the kidney stone passes. Please also strain your urine to collect the kidney stone. Store it in a container and take it to your urologist for analysis.   Follow-up with urology in a week to discuss your symptoms.   Return immediately to the emergency department if you experience any of the following: fever, unbearable pain, urinary retention, or any other concerning symptoms.    Thank you for visiting our Emergency Department. It was a pleasure taking care of you today.

## 2022-12-31 NOTE — ED Triage Notes (Signed)
Pt came in via POV d/t Rt flank pain that started 45 minutes ago, pt endorses urinary urgency started yesterday, & today he became diaphoretic & chills, 10/10 pain. Hx of inguinal hernia

## 2022-12-31 NOTE — ED Notes (Signed)
Patient transported to CT 

## 2022-12-31 NOTE — ED Provider Notes (Signed)
[facility] Provider Note   CSN: [id number] Arrival date & time: 12/31/22  1424     History  Chief Complaint  Patient presents with   Rt Flank Pain    Patrick Hopkins is a 27 y.o. male.  27 year old male with history of right inguinal hernia repair who presents emergency department with right-sided abdominal pain.  Patient reports that he was being evaluated for his hernia yesterday and had an ultrasound.  Says that since then has started to have severe right-sided abdominal pain and nausea without vomiting.  Has had diarrhea.  No fevers.  Says that he has felt tingling everywhere since the pain started.  No history of kidney stones.  Has been having some dysuria and urgency.  Still has appendix.       Home Medications Prior to Admission medications   Medication Sig Start Date End Date Taking? Authorizing Provider  oxyCODONE (ROXICODONE) 5 MG immediate release tablet Take 1 tablet (5 mg total) by mouth every 4 (four) hours as needed for severe pain. 12/31/22  Yes Fransico Meadow, MD  tamsulosin (FLOMAX) 0.4 MG CAPS capsule Take 1 capsule (0.4 mg total) by mouth daily. 12/31/22 01/30/23 Yes Fransico Meadow, MD  ibuprofen (ADVIL) 800 MG tablet Take 1 tablet (800 mg total) by mouth every 8 (eight) hours as needed. 06/29/19   Erroll Luna, MD      Allergies    Patient has no known allergies.    Review of Systems   Review of Systems  Physical Exam Updated Vital Signs BP 128/69 (BP Location: Right Arm)   Pulse 68   Temp 98 F (36.7 C) (Oral)   Resp 17   Ht 6' (1.829 m)   Wt 77.1 kg   SpO2 99%   BMI 23.06 kg/m  Physical Exam Vitals and nursing note reviewed.  Constitutional:      General: He is not in acute distress.    Appearance: He is well-developed.  HENT:     Head: Normocephalic and atraumatic.     Right Ear: External ear normal.     Left Ear: External ear normal.     Nose: Nose normal.  Eyes:     Extraocular  Movements: Extraocular movements intact.     Conjunctiva/sclera: Conjunctivae normal.     Pupils: Pupils are equal, round, and reactive to light.  Pulmonary:     Effort: Pulmonary effort is normal. No respiratory distress.  Abdominal:     General: There is no distension.     Palpations: Abdomen is soft. There is no mass.     Tenderness: There is abdominal tenderness (Mild right lower quadrant). There is right CVA tenderness. There is no left CVA tenderness or guarding.  Genitourinary:    Comments: No testicular swelling or pain Musculoskeletal:     Cervical back: Normal range of motion and neck supple.  Skin:    General: Skin is warm and dry.  Neurological:     Mental Status: He is alert. Mental status is at baseline.  Psychiatric:        Mood and Affect: Mood normal.        Behavior: Behavior normal.     ED Results / Procedures / Treatments   Labs (all labs ordered are listed, but only abnormal results are displayed) Labs Reviewed  BASIC METABOLIC PANEL - Abnormal; Notable for the following components:      Result Value   Glucose, Bld 121 (*)  All other components within normal limits  URINALYSIS, W/ REFLEX TO CULTURE (INFECTION SUSPECTED) - Abnormal; Notable for the following components:   Color, Urine STRAW (*)    Hgb urine dipstick SMALL (*)    Ketones, ur 5 (*)    All other components within normal limits  CBC    EKG None  Radiology CT ABDOMEN PELVIS W CONTRAST  Result Date: 12/31/2022 CLINICAL DATA:  Right flank and right lower quadrant pain. EXAM: CT ABDOMEN AND PELVIS WITH CONTRAST TECHNIQUE: Multidetector CT imaging of the abdomen and pelvis was performed using the standard protocol following bolus administration of intravenous contrast. RADIATION DOSE REDUCTION: This exam was performed according to the departmental dose-optimization program which includes automated exposure control, adjustment of the mA and/or kV according to patient size and/or use of  iterative reconstruction technique. CONTRAST:  50mL OMNIPAQUE IOHEXOL 350 MG/ML SOLN COMPARISON:  None Available. FINDINGS: Lower chest: Clear lung bases. Hepatobiliary: No focal liver abnormality is seen. No gallstones, gallbladder wall thickening, or biliary dilatation. Pancreas: Unremarkable. No pancreatic ductal dilatation or surrounding inflammatory changes. Spleen: Normal in size without focal abnormality. Adrenals/Urinary Tract: Normal adrenal glands. Kidneys normal in size, orientation and position. Relative delay and medullary enhancement of the right kidney compared to the left. Mild right hydronephrosis and hydroureter. Findings are due to a 2-3 mm stone in the distal ureter, 2 cm above the ureterovesicular junction. No left hydronephrosis. No renal masses. No intrarenal stones. Normal left ureter. Bladder mostly decompressed, otherwise unremarkable. Stomach/Bowel: Stomach is within normal limits. Appendix appears normal. No evidence of bowel wall thickening, distention, or inflammatory changes. Vascular/Lymphatic: No significant vascular findings are present. No enlarged abdominal or pelvic lymph nodes. Reproductive: Unremarkable. Other: No abdominal wall hernia or abnormality. No abdominopelvic ascites. 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 No fracture or acute finding. No bone lesion. IMPRESSION: 1. 2-3 mm stone in the distal right ureter causes mild right hydroureteronephrosis. 2. No other acute abnormality within the abdomen or pelvis. Normal appendix. 3. 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 Electronically Signed   By: Lajean Manes M.D.   On: 12/31/2022 17:26   US PELVIS LIMITED (TRANSABDOMINAL ONLY)  Result Date: 12/30/2022 CLINICAL DATA:  Right inguinal pain. EXAM: LIMITED ULTRASOUND OF PELVIS TECHNIQUE: Limited transabdominal ultrasound examination of the pelvis was performed. COMPARISON:  None  Available. FINDINGS: Multiple lymph nodes are identified in the right inguinal region. 4 measured lymph nodes all demonstrate a short axis less than 1 cm. All visualized lymph nodes retain their fatty hila. No abnormal cortical thickening identified. IMPRESSION: Morphologically normal lymph nodes are identified in the right inguinal region. No other abnormalities. Electronically Signed   By: Dorise Bullion III M.D.   On: 12/30/2022 17:59    Procedures Procedures   Medications Ordered in ED Medications  ondansetron (ZOFRAN) injection 4 mg (4 mg Intravenous Given 12/31/22 1535)  ketorolac (TORADOL) 15 MG/ML injection 15 mg (15 mg Intravenous Given 12/31/22 1536)  lactated ringers bolus 1,000 mL (0 mLs Intravenous Stopped 12/31/22 1702)  iohexol (OMNIPAQUE) 350 MG/ML injection 75 mL (75 mLs Intravenous Contrast Given 12/31/22 1717)  tamsulosin (FLOMAX) capsule 0.4 mg (0.4 mg Oral Given 12/31/22 1744)    ED Course/ Medical Decision Making/ A&P                             Medical Decision Making Amount and/or Complexity of Data Reviewed Labs: ordered. Radiology:  ordered.  Risk Prescription drug management.   Patrick Hopkins is a 27 y.o. male with comorbidities that complicate the patient evaluation including right inguinal hernia repair who presents emergency department with right-sided abdominal pain  Initial Ddx:  Incarcerated hernia, appendicitis, kidney stone, pyelonephritis  MDM:  Feel the patient likely has a kidney stone given his symptoms.  Will obtain urinalysis with his dysuria and urgency to rule out infection.  With his right lower quadrant abdominal pain and history of hernia repair will obtain CT of the abdomen pelvis with IV contrast to rule these out.  Plan:  Labs Urinalysis CT abdomen pelvis IV contrast Toradol Zofran  ED Summary/Re-evaluation:  Patient underwent the above workup which showed a 2 to 3 mm stone in the distal right ureter causing hydronephrosis.   Urinalysis not consistent with UTI.  Patient's pain was controlled with Toradol.  Was discharged home with strainer and prescription of oxycodone with instructions on supportive care for his kidney stone.  Will have him follow-up with urology in office.  This patient presents to the ED for concern of complaints listed in HPI, this involves an extensive number of treatment options, and is a complaint that carries with it a high risk of complications and morbidity. Disposition including potential need for admission considered.   Dispo: DC Home. Return precautions discussed including, but not limited to, those listed in the AVS. Allowed pt time to ask questions which were answered fully prior to dc.  Additional history obtained from mother Records reviewed Outpatient Clinic Notes The following labs were independently interpreted: Urinalysis and show no acute abnormality I independently reviewed the following imaging with scope of interpretation limited to determining acute life threatening conditions related to emergency care: CT Abdomen/Pelvis and agree with the radiologist interpretation with the following exceptions: none I personally reviewed and interpreted cardiac monitoring: normal sinus rhythm  I personally reviewed and interpreted the pt's EKG: see above for interpretation  I have reviewed the patients home medications and made adjustments as needed  Final Clinical Impression(s) / ED Diagnoses Final diagnoses:  Kidney stone  Hydronephrosis with urinary obstruction due to renal calculus    Rx / DC Orders ED Discharge Orders          Ordered    tamsulosin (FLOMAX) 0.4 MG CAPS capsule  Daily        12/31/22 1948    oxyCODONE (ROXICODONE) 5 MG immediate release tablet  Every 4 hours PRN        12/31/22 1948              Fransico Meadow, MD 12/31/22 1950

## 2024-04-22 ENCOUNTER — Encounter: Payer: Self-pay | Admitting: Advanced Practice Midwife
